# Patient Record
Sex: Male | Born: 1985 | Race: White | Hispanic: No | Marital: Single | Smoking: Current every day smoker
Health system: Southern US, Community
[De-identification: ages and names within clinical notes are randomized; demographics above are authoritative.]

---

## 2015-03-14 ENCOUNTER — Ambulatory Visit (INDEPENDENT_AMBULATORY_CARE_PROVIDER_SITE_OTHER): Payer: BLUE CROSS/BLUE SHIELD

## 2015-03-14 ENCOUNTER — Ambulatory Visit (INDEPENDENT_AMBULATORY_CARE_PROVIDER_SITE_OTHER): Payer: BLUE CROSS/BLUE SHIELD | Admitting: Emergency Medicine

## 2015-03-14 VITALS — BP 130/80 | HR 64 | Temp 97.9°F | Resp 18 | Ht 74.0 in | Wt 295.4 lb

## 2015-03-14 DIAGNOSIS — M25561 Pain in right knee: Secondary | ICD-10-CM | POA: Diagnosis not present

## 2015-03-14 DIAGNOSIS — Z202 Contact with and (suspected) exposure to infections with a predominantly sexual mode of transmission: Secondary | ICD-10-CM | POA: Diagnosis not present

## 2015-03-14 LAB — HIV ANTIBODY (ROUTINE TESTING W REFLEX): HIV: NONREACTIVE

## 2015-03-14 LAB — RPR

## 2015-03-14 MED ORDER — NAPROXEN 500 MG PO TABS: 500.0000 mg | ORAL_TABLET | Freq: Two times a day (BID) | ORAL | Status: AC

## 2015-03-14 NOTE — Progress Notes (Signed)
Subjective:  Patient ID: Travis Shaffer, male    DOB: 11/30/1985  Age: 29 y.o. MRN: [id number]  CC: Knee Pain   HPI Travis Shaffer presents  with pain in his right knee. He has no history of direct injury. Has had pain this been increasing in the right knee for the last 10 days. He has some swelling. Has pain with ambulation but is markedly impaired when it comes to extending his knee against gravity. He has no history of prior injury to his knee. 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 His knee clicks but doesn't walk.  History Travis Shaffer has no past medical history on file.   He has no past surgical history on file.   His  family history is not on file.  He   reports that he has been smoking.  He does not have any smokeless tobacco history on file. His alcohol and drug histories are not on file.  No outpatient prescriptions prior to visit.   No facility-administered medications prior to visit.    Social History   Social History  . Marital Status: Single    Spouse Name: N/A  . Number of Children: N/A  . Years of Education: N/A   Social History Main Topics  . Smoking status: Current Every Day Smoker  . Smokeless tobacco: None  . Alcohol Use: None  . Drug Use: None  . Sexual Activity: Not Asked   Other Topics Concern  . None   Social History Narrative  . None     Review of Systems  Constitutional: Negative for fever, chills and appetite change.  HENT: Negative for congestion, ear pain, postnasal drip, sinus pressure and sore throat.   Eyes: Negative for pain and redness.  Respiratory: Negative for cough, shortness of breath and wheezing.   Cardiovascular: Negative for leg swelling.  Gastrointestinal: Negative for nausea, vomiting, abdominal pain, diarrhea, constipation and blood in stool.  Endocrine: Negative for polyuria.  Genitourinary: Negative for dysuria, urgency, frequency and flank pain.  Musculoskeletal: Positive for joint swelling and  arthralgias. Negative for gait problem.  Skin: Negative for rash.  Neurological: Negative for weakness and headaches.  Psychiatric/Behavioral: Negative for confusion and decreased concentration. The patient is not nervous/anxious.     Objective:  BP 130/80 mmHg  Pulse 64  Temp(Src) 97.9 F (36.6 C) (Oral)  Resp 18  Ht 6\' 2"  (1.88 m)  Wt 295 lb 6.4 oz (133.993 kg)  BMI 37.91 kg/m2  SpO2 98%  Physical Exam  Constitutional: He is oriented to person, place, and time. He appears well-developed and well-nourished. No distress.  HENT:  Head: Normocephalic and atraumatic.  Right Ear: External ear normal.  Left Ear: External ear normal.  Nose: Nose normal.  Eyes: Conjunctivae and EOM are normal. Pupils are equal, round, and reactive to light. No scleral icterus.  Neck: Normal range of motion. Neck supple. No tracheal deviation present.  Cardiovascular: Normal rate, regular rhythm and normal heart sounds.   Pulmonary/Chest: Effort normal. No respiratory distress. He has no wheezes. He has no rales.  Abdominal: He exhibits no mass. There is no tenderness. There is no rebound and no guarding.  Musculoskeletal: He exhibits no edema.  Lymphadenopathy:    He has no cervical adenopathy.  Neurological: He is alert and oriented to person, place, and time. Coordination normal.  Skin: Skin is warm and dry. No rash noted.  Psychiatric: He has a normal mood and affect. His behavior is normal.  He has some effusion of the right knee the knee joint is clinically stable he has weak extension of the knee. There is no deformity. No clicking or locking   Assessment & Plan:   Travis Shaffer was seen today for knee pain.  Diagnoses and all orders for this visit:  Right knee pain -     DG Knee Complete 4 Views Right; Future  STD exposure -     GC/Chlamydia Probe Amp -     HSV(herpes simplex vrs) 1+2 ab-IgG -     HIV antibody -     RPR  Other orders -     naproxen (NAPROSYN) 500 MG tablet; Take 1  tablet (500 mg total) by mouth 2 (two) times daily with a meal.   I am having Travis Shaffer start on naproxen.  Meds ordered this encounter  Medications  . naproxen (NAPROSYN) 500 MG tablet    Sig: Take 1 tablet (500 mg total) by mouth 2 (two) times daily with a meal.    Dispense:  60 tablet    Refill:  0    Appropriate red flag conditions were discussed with the patient as well as actions that should be taken.  Patient expressed his understanding.  Follow-up: Return in about 1 week (around 03/21/2015).  Carmelina Dane, MD   [facility] reading (PRIMARY) by  Dr. Dareen Piano negative knee.

## 2015-03-14 NOTE — Patient Instructions (Signed)

## 2015-03-15 LAB — HSV(HERPES SIMPLEX VRS) I + II AB-IGG: HSV 1 GLYCOPROTEIN G AB, IGG: 6.71 IV — AB

## 2015-03-16 LAB — GC/CHLAMYDIA PROBE AMP
CT Probe RNA: NEGATIVE
GC Probe RNA: NEGATIVE

## 2017-06-15 IMAGING — CR DG KNEE COMPLETE 4+V*R*
4 series · 4 of 4 positions shown · non-contrast
Comparison: None.

CLINICAL DATA: Acute right knee pain for 1 week.

EXAM:
RIGHT KNEE - COMPLETE 4+ VIEW

[AP]
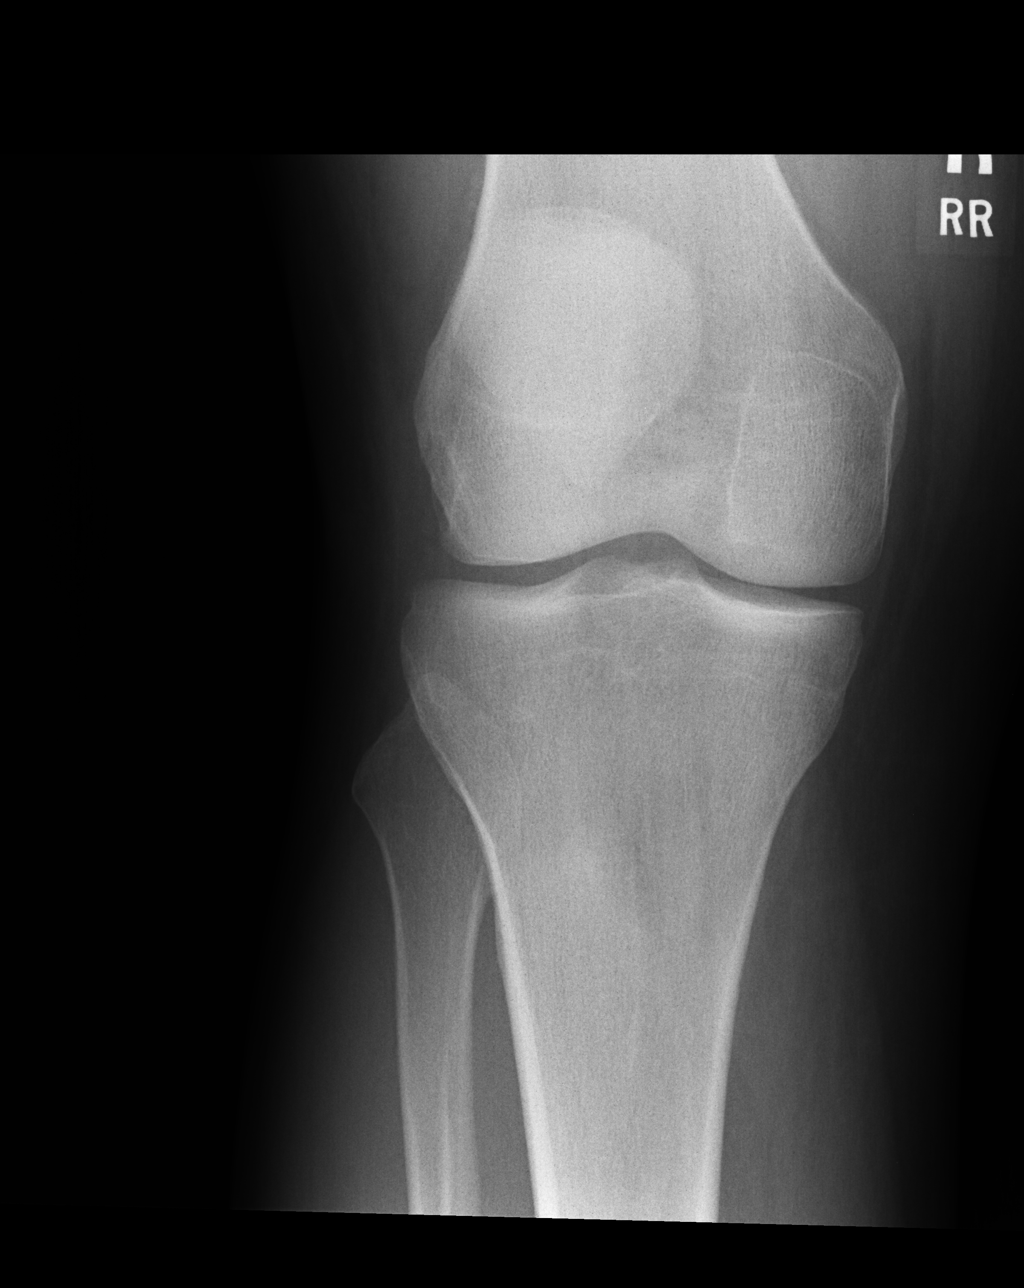

[lateral]
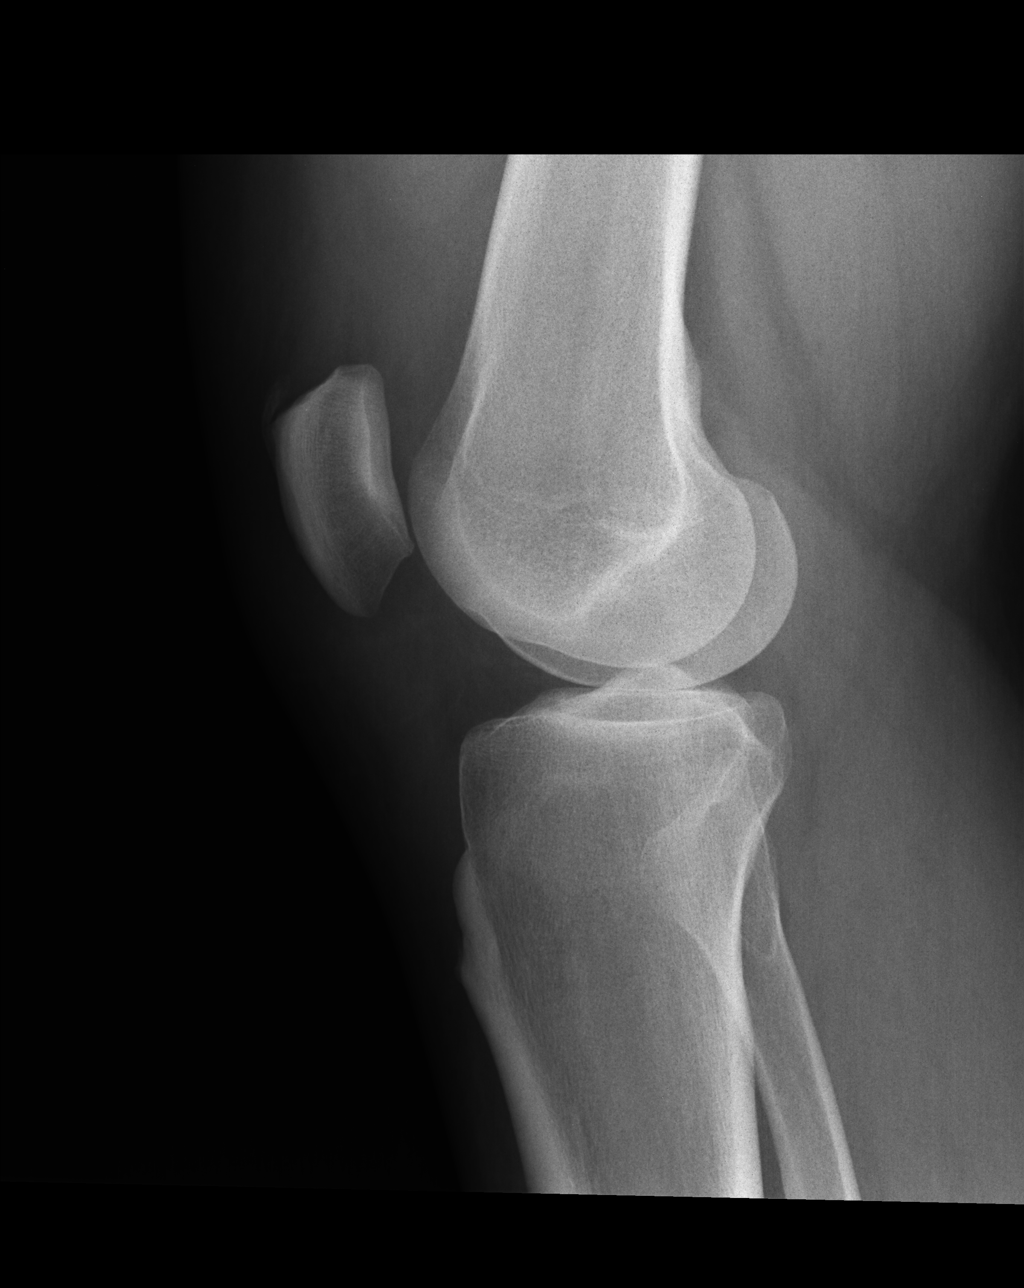

[sunrise]
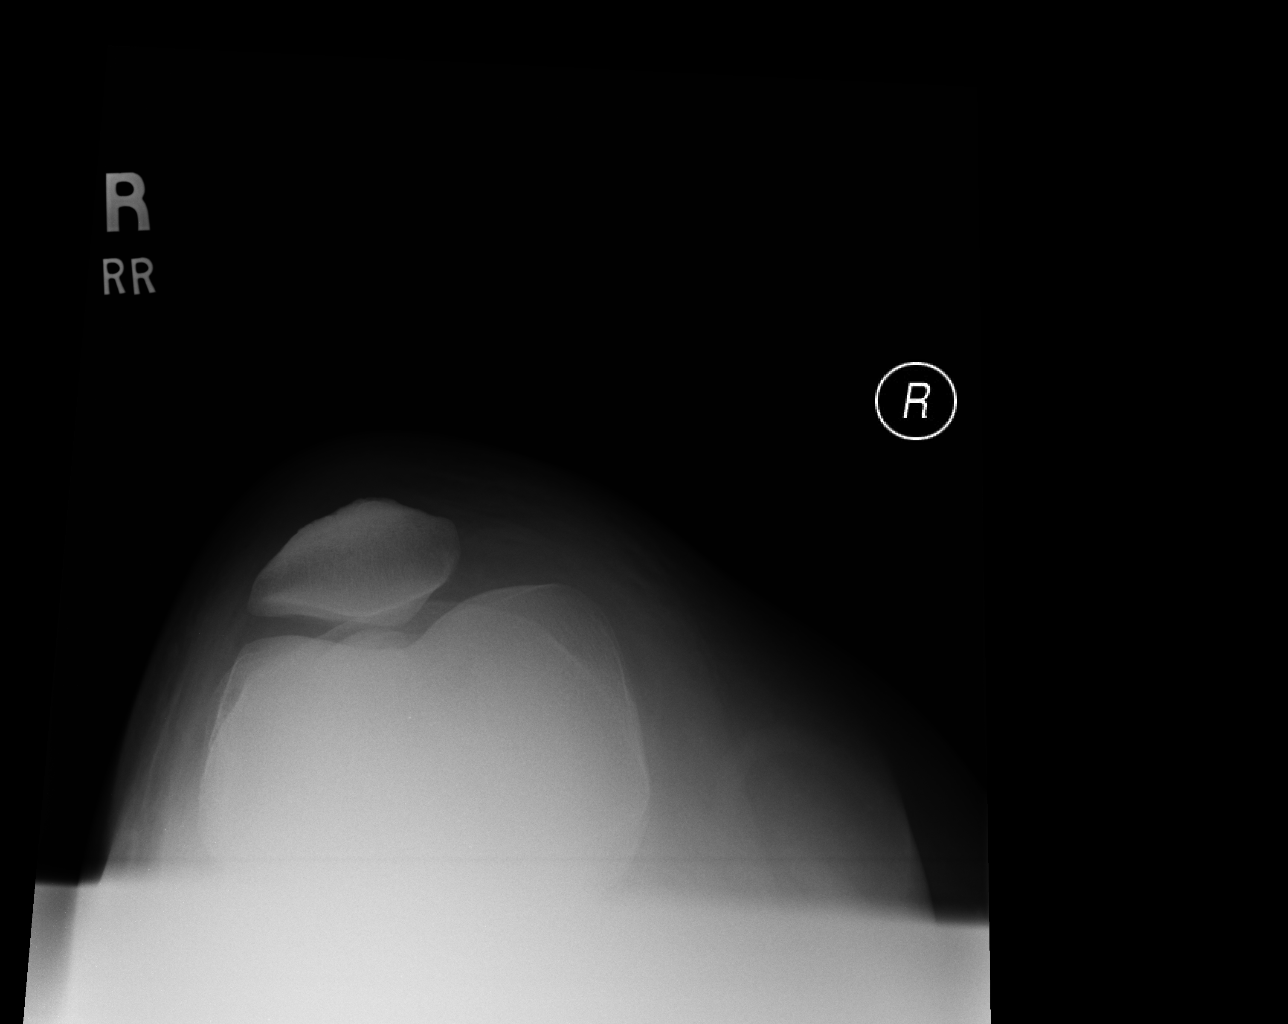

[pa axial]
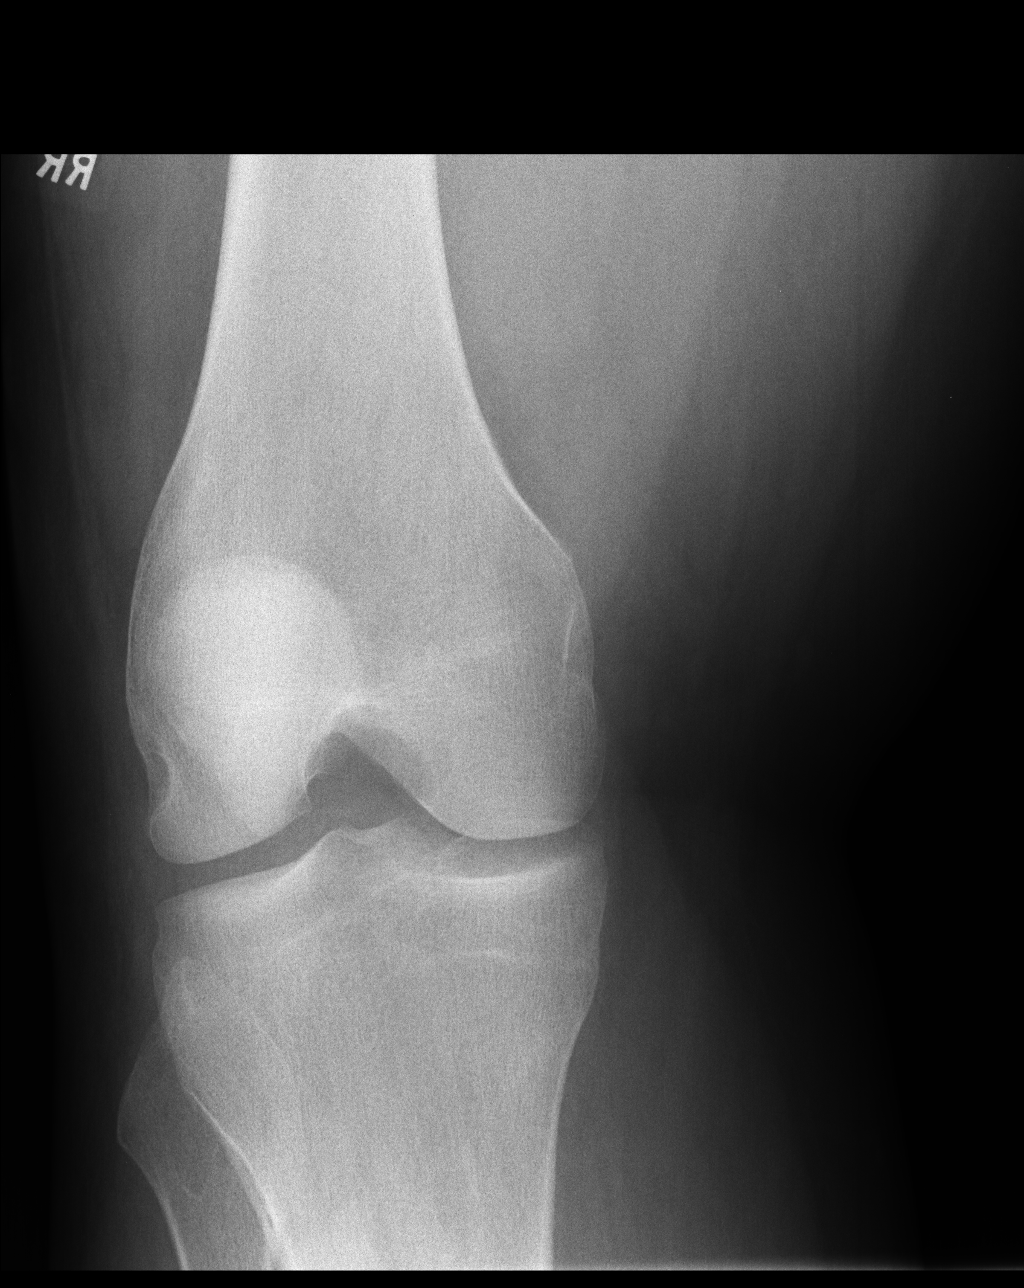

[4 of 4 positions shown; findings below may reference images not displayed]

FINDINGS: There is no evidence of fracture, dislocation, or joint effusion.
There is no evidence of arthropathy or other focal bone abnormality.
Soft tissues are unremarkable.
IMPRESSION: Negative.

## 2022-10-22 ENCOUNTER — Ambulatory Visit (HOSPITAL_COMMUNITY): Admission: EM | Admit: 2022-10-22 | Discharge: 2022-10-22 | Discharge status: Absent without leave | Payer: Self-pay
